# Patient Record
Sex: Female | Born: 1970 | Race: White | Hispanic: No | Marital: Married | Smoking: Never smoker
Health system: Southern US, Community
[De-identification: ages and names within clinical notes are randomized; demographics above are authoritative.]

## PROBLEM LIST (undated history)

## (undated) DIAGNOSIS — J45909 Unspecified asthma, uncomplicated: Secondary | ICD-10-CM

## (undated) DIAGNOSIS — C801 Malignant (primary) neoplasm, unspecified: Secondary | ICD-10-CM

## (undated) DIAGNOSIS — E079 Disorder of thyroid, unspecified: Secondary | ICD-10-CM

## (undated) DIAGNOSIS — K219 Gastro-esophageal reflux disease without esophagitis: Secondary | ICD-10-CM

## (undated) DIAGNOSIS — G4730 Sleep apnea, unspecified: Secondary | ICD-10-CM

## (undated) DIAGNOSIS — T7840XA Allergy, unspecified, initial encounter: Secondary | ICD-10-CM

## (undated) HISTORY — DX: Unspecified asthma, uncomplicated: J45.909

## (undated) HISTORY — DX: Disorder of thyroid, unspecified: E07.9

## (undated) HISTORY — DX: Sleep apnea, unspecified: G47.30

## (undated) HISTORY — PX: APPENDECTOMY: SHX54

## (undated) HISTORY — DX: Gastro-esophageal reflux disease without esophagitis: K21.9

## (undated) HISTORY — DX: Malignant (primary) neoplasm, unspecified: C80.1

## (undated) HISTORY — DX: Allergy, unspecified, initial encounter: T78.40XA

---

## 2021-09-30 ENCOUNTER — Other Ambulatory Visit: Payer: Self-pay | Admitting: Family Medicine

## 2021-09-30 DIAGNOSIS — R102 Pelvic and perineal pain: Secondary | ICD-10-CM

## 2021-10-03 ENCOUNTER — Ambulatory Visit
Admission: RE | Admit: 2021-10-03 | Discharge: 2021-10-03 | Disposition: A | Discharge status: Home / Self Care | Payer: Self-pay | Source: Ambulatory Visit | Attending: Family Medicine | Admitting: Family Medicine

## 2021-10-03 DIAGNOSIS — R102 Pelvic and perineal pain: Secondary | ICD-10-CM

## 2022-05-05 DIAGNOSIS — Z20822 Contact with and (suspected) exposure to covid-19: Secondary | ICD-10-CM | POA: Diagnosis not present

## 2022-05-05 DIAGNOSIS — J45909 Unspecified asthma, uncomplicated: Secondary | ICD-10-CM | POA: Diagnosis not present

## 2022-05-05 DIAGNOSIS — U071 COVID-19: Secondary | ICD-10-CM | POA: Diagnosis not present

## 2022-05-05 DIAGNOSIS — J020 Streptococcal pharyngitis: Secondary | ICD-10-CM | POA: Diagnosis not present

## 2022-05-05 DIAGNOSIS — R059 Cough, unspecified: Secondary | ICD-10-CM | POA: Diagnosis not present

## 2022-05-12 DIAGNOSIS — E039 Hypothyroidism, unspecified: Secondary | ICD-10-CM | POA: Diagnosis not present

## 2022-05-12 DIAGNOSIS — Z13220 Encounter for screening for lipoid disorders: Secondary | ICD-10-CM | POA: Diagnosis not present

## 2022-05-12 DIAGNOSIS — Z131 Encounter for screening for diabetes mellitus: Secondary | ICD-10-CM | POA: Diagnosis not present

## 2022-05-18 DIAGNOSIS — J45909 Unspecified asthma, uncomplicated: Secondary | ICD-10-CM | POA: Diagnosis not present

## 2022-05-18 DIAGNOSIS — Z1211 Encounter for screening for malignant neoplasm of colon: Secondary | ICD-10-CM | POA: Diagnosis not present

## 2022-05-18 DIAGNOSIS — Z6831 Body mass index (BMI) 31.0-31.9, adult: Secondary | ICD-10-CM | POA: Diagnosis not present

## 2022-05-18 DIAGNOSIS — Z1231 Encounter for screening mammogram for malignant neoplasm of breast: Secondary | ICD-10-CM | POA: Diagnosis not present

## 2022-05-18 DIAGNOSIS — Z0000 Encounter for general adult medical examination without abnormal findings: Secondary | ICD-10-CM | POA: Diagnosis not present

## 2022-08-13 DIAGNOSIS — N760 Acute vaginitis: Secondary | ICD-10-CM | POA: Diagnosis not present

## 2022-08-13 DIAGNOSIS — R350 Frequency of micturition: Secondary | ICD-10-CM | POA: Diagnosis not present

## 2022-08-13 DIAGNOSIS — Z23 Encounter for immunization: Secondary | ICD-10-CM | POA: Diagnosis not present

## 2022-08-13 DIAGNOSIS — Z6831 Body mass index (BMI) 31.0-31.9, adult: Secondary | ICD-10-CM | POA: Diagnosis not present

## 2022-08-26 ENCOUNTER — Ambulatory Visit (AMBULATORY_SURGERY_CENTER): Payer: 59

## 2022-08-26 VITALS — Ht 66.0 in | Wt 192.0 lb

## 2022-08-26 DIAGNOSIS — Z1211 Encounter for screening for malignant neoplasm of colon: Secondary | ICD-10-CM

## 2022-08-26 MED ORDER — PLENVU 140 G PO SOLR: 1.0000 | ORAL | Status: DC

## 2022-08-26 NOTE — Progress Notes (Signed)

## 2022-09-17 ENCOUNTER — Telehealth: Payer: Self-pay | Admitting: Gastroenterology

## 2022-09-17 NOTE — Telephone Encounter (Signed)
Inbound call from patient stating that her pharmacy does not have her prep prescription and is requesting it be resent. Please advise.

## 2022-09-17 NOTE — Telephone Encounter (Signed)
[facility] pharmacy. Verbal order for prescription given over the phone to [place] along with Plenvu coupon code. Called patient and let her know that the prescription has been received by the pharmacy. Per patient went to pick up her RX and it was not at the pharmacy. Prep previously sent into the pharmacy on 08/26/22. Pt understand that her selected pharmacy may not get her PLENVU kit in time for her procedure on 09/21/22 due to their hours of operation. She understands if she does not receive her kit by tomorrow she will need to call us back so that we can send her RX to another pharmacy.

## 2022-09-18 ENCOUNTER — Telehealth: Payer: Self-pay | Admitting: *Deleted

## 2022-09-18 ENCOUNTER — Encounter: Payer: Self-pay | Admitting: Gastroenterology

## 2022-09-18 NOTE — Telephone Encounter (Signed)
Returned cal to [employer] at pharmacy. She stated that insurance did not cover the prep Plenvu. Gave Gisell coupon [employer] and ID on coupon.  #'s given

## 2022-09-18 NOTE — Telephone Encounter (Signed)
Called pt.to discuss prep option,unable to give suprep d/t allergy contraindications,instructed pt. To get over the counter miralax prep because pt. Said she can not do gallon jug,new instructions sent via my chart reviewed and verified with pt.   answered all questions.

## 2022-09-18 NOTE — Telephone Encounter (Signed)
Inbound call from [facility] with [facility] stating that patient is needing a PA for prep medication and is requesting we send different prep for patient. Patient is scheduled to have procedure on 1/22. Please advise.

## 2022-09-21 ENCOUNTER — Encounter: Payer: Self-pay | Admitting: Gastroenterology

## 2022-09-21 ENCOUNTER — Ambulatory Visit (AMBULATORY_SURGERY_CENTER): Payer: 59 | Admitting: Gastroenterology

## 2022-09-21 VITALS — BP 108/68 | HR 81 | Temp 97.8°F | Resp 15 | Ht 66.0 in | Wt 192.0 lb

## 2022-09-21 DIAGNOSIS — Z1211 Encounter for screening for malignant neoplasm of colon: Secondary | ICD-10-CM

## 2022-09-21 MED ORDER — SODIUM CHLORIDE 0.9 % IV SOLN: 500.0000 mL | Freq: Once | INTRAVENOUS | Status: DC

## 2022-09-21 NOTE — Progress Notes (Signed)
Pt's states no medical or surgical changes since previsit or office visit. 

## 2022-09-21 NOTE — Op Note (Signed)
[place] Patient Name: Dawn Ramirez Procedure Date: 09/21/2022 2:24 PM MRN: [id number] Endoscopist: Jackquline Denmark , MD, [id number] Age: 52 Referring MD:  Date of Birth: 10/03/1970 Gender: Female Account #: [id number] Procedure:                Colonoscopy Indications:              Screening for colorectal malignant neoplasm Medicines:                Monitored Anesthesia Care Procedure:                Pre-Anesthesia Assessment:                           - Prior to the procedure, a History and Physical                            was performed, and patient medications and                            allergies were reviewed. The patient's tolerance of                            previous anesthesia was also reviewed. The risks                            and benefits of the procedure and the sedation                            options and risks were discussed with the patient.                            All questions were answered, and informed consent                            was obtained. Prior Anticoagulants: The patient has                            taken no anticoagulant or antiplatelet agents. ASA                            Grade Assessment: II - A patient with mild systemic                            disease. After reviewing the risks and benefits,                            the patient was deemed in satisfactory condition to                            undergo the procedure.                           After obtaining informed consent, the colonoscope  was passed under direct vision. Throughout the                            procedure, the patient's blood pressure, pulse, and                            oxygen saturations were monitored continuously. The                            Olympus PCF-H190DL (#[id number]) Colonoscope was                            introduced through the anus and advanced to the 2                            cm into the ileum. The  colonoscopy was performed                            without difficulty. The patient tolerated the                            procedure well. The quality of the bowel                            preparation was good. The terminal ileum, ileocecal                            valve, appendiceal orifice, and rectum were                            photographed. Scope In: 2:29:44 PM Scope Out: 2:44:05 PM Scope Withdrawal Time: 0 hours 11 minutes 0 seconds  Total Procedure Duration: 0 hours 14 minutes 21 seconds  Findings:                 Multiple medium-mouthed diverticula were found in                            the sigmoid colon, few in descending colon and                            ascending colon.                           Non-bleeding internal hemorrhoids were found during                            retroflexion. The hemorrhoids were small and Grade                            I (internal hemorrhoids that do not prolapse).                           The terminal ileum appeared normal.  The exam was otherwise without abnormality on                            direct and retroflexion views. Complications:            No immediate complications. Estimated Blood Loss:     Estimated blood loss: none. Impression:               - Moderate predominantly sigmoid diverticulosis.                           - Non-bleeding internal hemorrhoids.                           - The examined portion of the ileum was normal.                           - The examination was otherwise normal on direct                            and retroflexion views.                           - No specimens collected. Recommendation:           - Patient has a contact number available for                            emergencies. The signs and symptoms of potential                            delayed complications were discussed with the                            patient. Return to normal activities tomorrow.                             Written discharge instructions were provided to the                            patient.                           - High fiber diet.                           - Continue present medications.                           - Repeat colonoscopy in 10 years for screening                            purposes. Earlier, if with any problems or change                            in family history.                           -  The findings and recommendations were discussed                            with the patient's family. Jackquline Denmark, MD 09/21/2022 2:48:00 PM This report has been signed electronically.

## 2022-09-21 NOTE — Progress Notes (Signed)
Pt resting comfortably. VSS. Airway intact. SBAR complete to RN. All questions answered.   

## 2022-09-21 NOTE — Patient Instructions (Signed)

## 2022-09-21 NOTE — Progress Notes (Signed)
[facility] History and Physical   Primary Care Physician:  Marco Collie, MD   Reason for Procedure:   CRC  screening  Plan:    colon    HPI: Dawn Ramirez is a 52 y.o. female   H/O IBS-C  No nausea, vomiting, heartburn, regurgitation, odynophagia or dysphagia.  No significant diarrhea.  No melena or hematochezia. No unintentional weight loss. No abdominal pain.  Past Medical History:  Diagnosis Date   Allergy    Asthma    Cancer ([place])    GERD (gastroesophageal reflux disease)    Sleep apnea    Thyroid disease     Past Surgical History:  Procedure Laterality Date   APPENDECTOMY     47yr    Prior to Admission medications   Medication Sig Start Date End Date Taking? Authorizing Provider  levocetirizine (XYZAL) 5 MG tablet Take 5 mg by mouth at bedtime.   Yes [provider]  omeprazole (PRILOSEC) 20 MG capsule Take by mouth at bedtime. 08/05/22  Yes [provider]  SYNTHROID 125 MCG tablet Take 125 mcg by mouth daily.   Yes [provider]  albuterol (PROVENTIL) (2.5 MG/3ML) 0.083% nebulizer solution Take 2.5 mg by nebulization every 4 (four) hours as needed. 05/18/22   [provider]  EPINEPHrine 0.3 mg/0.3 mL IJ SOAJ injection Inject into the muscle. 10/13/17   [provider]  Multiple Vitamin (MULTI-VITAMIN) tablet Take 1 tablet by mouth daily.    [provider]    Current Outpatient Medications  Medication Sig Dispense Refill   levocetirizine (XYZAL) 5 MG tablet Take 5 mg by mouth at bedtime.     omeprazole (PRILOSEC) 20 MG capsule Take by mouth at bedtime.     SYNTHROID 125 MCG tablet Take 125 mcg by mouth daily.     albuterol (PROVENTIL) (2.5 MG/3ML) 0.083% nebulizer solution Take 2.5 mg by nebulization every 4 (four) hours as needed.     EPINEPHrine 0.3 mg/0.3 mL IJ SOAJ injection Inject into the muscle.     Multiple Vitamin (MULTI-VITAMIN) tablet Take 1 tablet by mouth daily.     No current  facility-administered medications for this visit.    Allergies as of 09/21/2022 - Review Complete 09/21/2022  Allergen Reaction Noted   Antivenin, funnel web spider [antivenin, atrax robustus] Hives 08/26/2022   Bacitra-neomycin-polymyxin-hc Swelling 10/30/2021   Onion Cough 08/26/2022   Pineapple Cough 08/26/2022   Tomato Cough 08/26/2022   Turkey-sweet potatoes-peaches [alitraq] Cough 08/26/2022   Yeast-related products Cough 08/26/2022   Sulfamethoxazole-trimethoprim Other (See Comments), Rash, and Swelling 12/29/2012    Family History  Problem Relation Age of Onset   Colon cancer Neg Hx    Colon polyps Neg Hx    Esophageal cancer Neg Hx    Rectal cancer Neg Hx    Stomach cancer Neg Hx     Social History   Socioeconomic History   Marital status: Married    Spouse name: Not on file   Number of children: Not on file   Years of education: Not on file   Highest education level: Not on file  Occupational History   Not on file  Tobacco Use   Smoking status: Never    Passive exposure: Never   Smokeless tobacco: Never  Vaping Use   Vaping Use: Never used  Substance and Sexual Activity   Alcohol use: Yes    Comment: ocassional   Drug use: Never   Sexual activity: Not on file  Other Topics Concern  Not on file  Social History Narrative   Not on file   Social Determinants of Health   Financial Resource Strain: Not on file  Food Insecurity: Not on file  Transportation Needs: Not on file  Physical Activity: Not on file  Stress: Not on file  Social Connections: Not on file  Intimate Partner Violence: Not on file    Review of Systems: Positive for none All other review of systems negative except as mentioned in the HPI.  Physical Exam: Vital signs in last 24 hours: '@VSRANGES'$ @   General:   Alert,  Well-developed, well-nourished, pleasant and cooperative in NAD Lungs:  Clear throughout to auscultation.   Heart:  Regular rate and rhythm; no murmurs, clicks,  rubs,  or gallops. Abdomen:  Soft, nontender and nondistended. Normal bowel sounds.   Neuro/Psych:  Alert and cooperative. Normal mood and affect. A and O x 3    No significant changes were identified.  The patient continues to be an appropriate candidate for the planned procedure and anesthesia.   Carmell Austria, MD. [facility] 09/21/2022 3:43 PM@

## 2022-09-22 ENCOUNTER — Telehealth: Payer: Self-pay

## 2022-09-22 NOTE — Telephone Encounter (Signed)
No answer, left message to call if having any issues or concerns, B.Khloey Chern RN 

## 2022-11-09 DIAGNOSIS — E039 Hypothyroidism, unspecified: Secondary | ICD-10-CM | POA: Diagnosis not present

## 2022-11-16 DIAGNOSIS — Z6830 Body mass index (BMI) 30.0-30.9, adult: Secondary | ICD-10-CM | POA: Diagnosis not present

## 2022-11-16 DIAGNOSIS — E039 Hypothyroidism, unspecified: Secondary | ICD-10-CM | POA: Diagnosis not present

## 2022-12-21 DIAGNOSIS — Z85820 Personal history of malignant melanoma of skin: Secondary | ICD-10-CM | POA: Diagnosis not present

## 2022-12-21 DIAGNOSIS — D225 Melanocytic nevi of trunk: Secondary | ICD-10-CM | POA: Diagnosis not present

## 2022-12-21 DIAGNOSIS — D2239 Melanocytic nevi of other parts of face: Secondary | ICD-10-CM | POA: Diagnosis not present

## 2022-12-21 DIAGNOSIS — L821 Other seborrheic keratosis: Secondary | ICD-10-CM | POA: Diagnosis not present

## 2022-12-25 DIAGNOSIS — E039 Hypothyroidism, unspecified: Secondary | ICD-10-CM | POA: Diagnosis not present

## 2023-01-05 DIAGNOSIS — N949 Unspecified condition associated with female genital organs and menstrual cycle: Secondary | ICD-10-CM | POA: Diagnosis not present

## 2023-01-05 DIAGNOSIS — Z6830 Body mass index (BMI) 30.0-30.9, adult: Secondary | ICD-10-CM | POA: Diagnosis not present

## 2023-01-05 DIAGNOSIS — N9089 Other specified noninflammatory disorders of vulva and perineum: Secondary | ICD-10-CM | POA: Diagnosis not present

## 2023-01-07 DIAGNOSIS — R300 Dysuria: Secondary | ICD-10-CM | POA: Diagnosis not present

## 2023-01-07 DIAGNOSIS — N909 Noninflammatory disorder of vulva and perineum, unspecified: Secondary | ICD-10-CM | POA: Diagnosis not present

## 2023-06-25 DIAGNOSIS — Z1231 Encounter for screening mammogram for malignant neoplasm of breast: Secondary | ICD-10-CM | POA: Diagnosis not present

## 2023-08-06 IMAGING — US US PELVIS COMPLETE WITH TRANSVAGINAL
1 series · 13 of 25 positions shown · non-contrast
Comparison: 06/01/2014

CLINICAL DATA: Pelvic pain for 1 weeks

Bleeding between cycles for 4-5 months
EXAM:
TRANSABDOMINAL AND TRANSVAGINAL ULTRASOUND OF PELVIS
TECHNIQUE: Both transabdominal and transvaginal ultrasound examinations of the
pelvis were performed. Transabdominal technique was performed for
global imaging of the pelvis including uterus, ovaries, adnexal
regions, and pelvic cul-de-sac. It was necessary to proceed with
endovaginal exam following the transabdominal exam to visualize the
uterus, endometrium, ovaries, and adnexa.

[Series 1: us pelvis complete with transvaginal · 0.26mm/px · 13 of 34 slices shown]
[im 1/34]
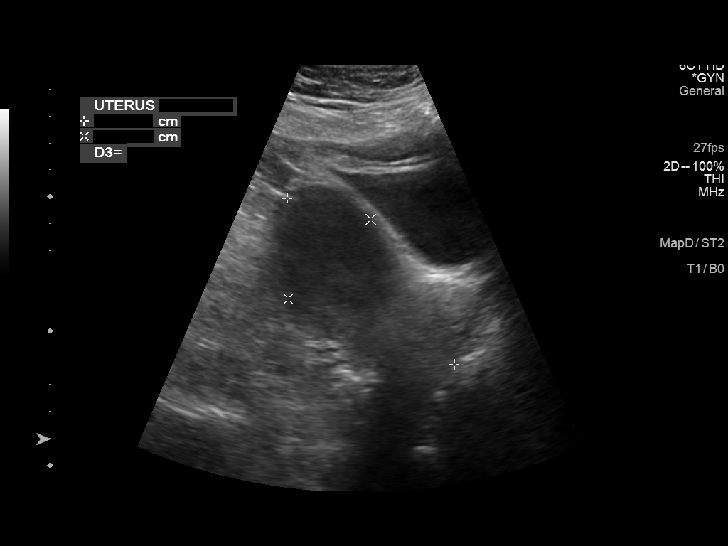
[im 3/34]
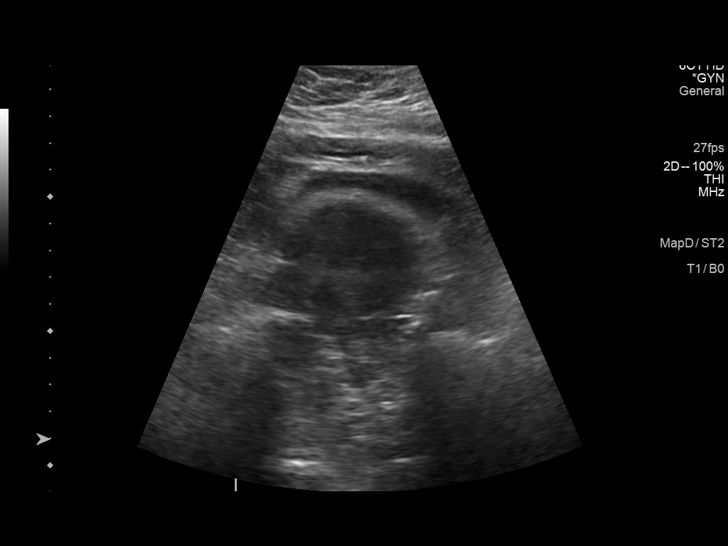
[im 6/34]
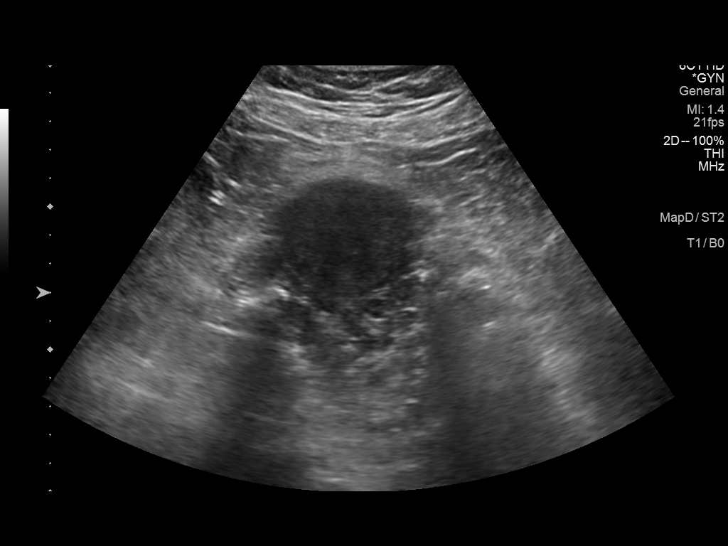
[im 9/34]
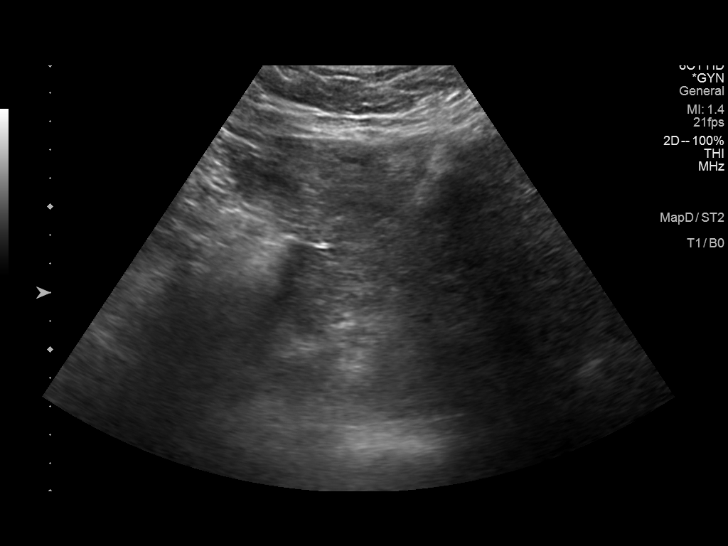
[im 12/34]
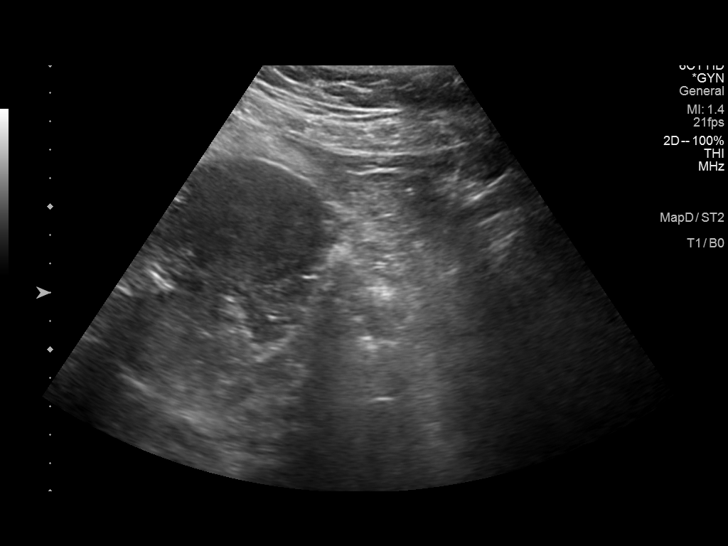
[im 14/34]
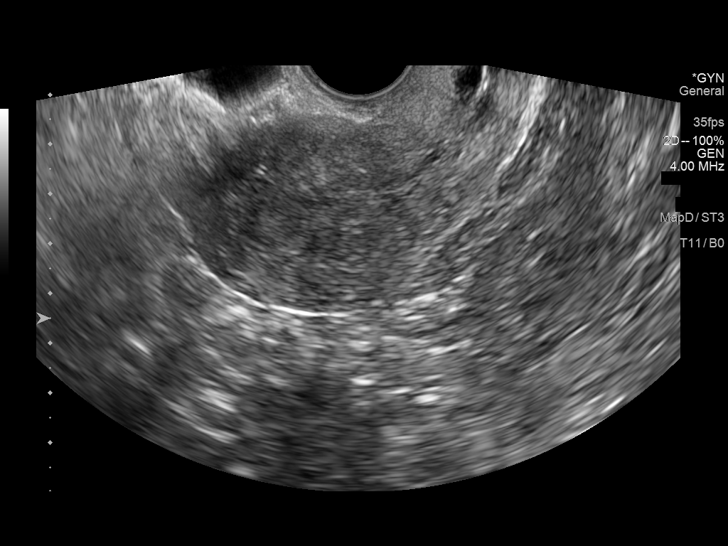
[im 17/34]
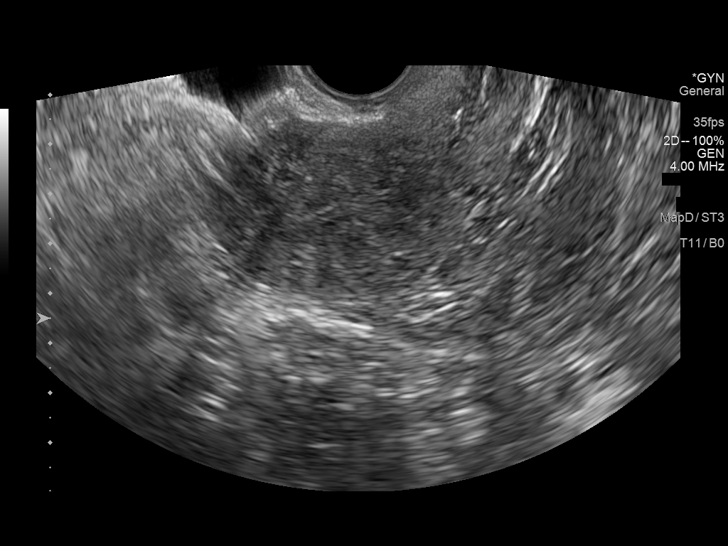
[im 20/34]
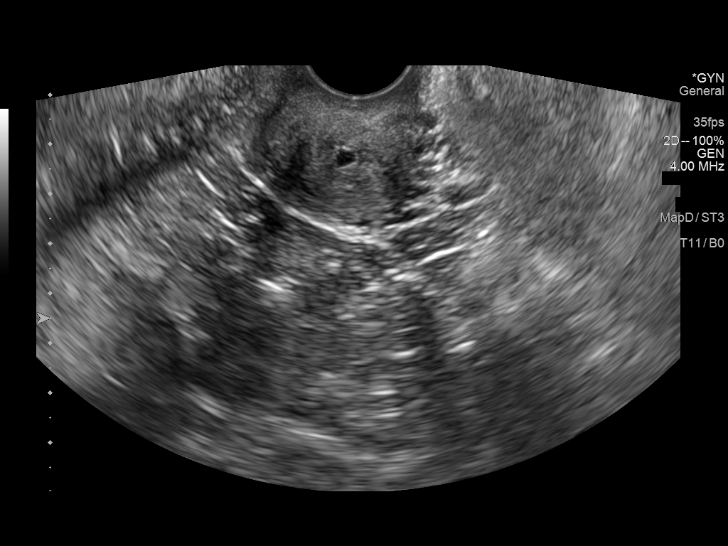
[im 23/34]
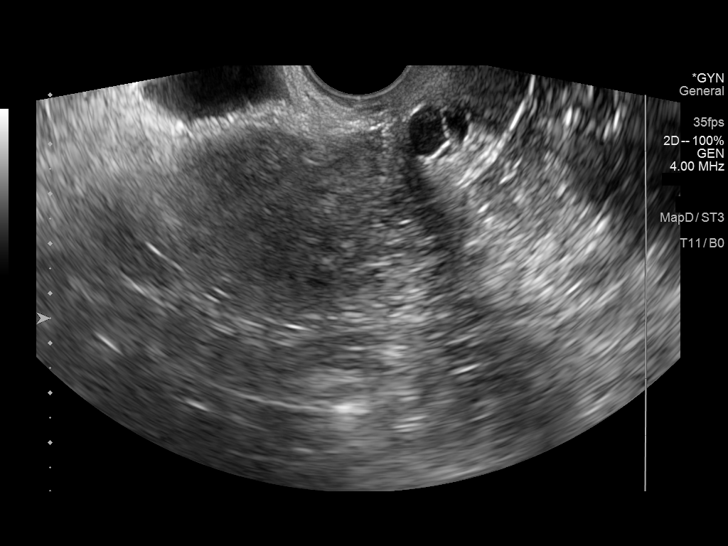
[im 25/34]
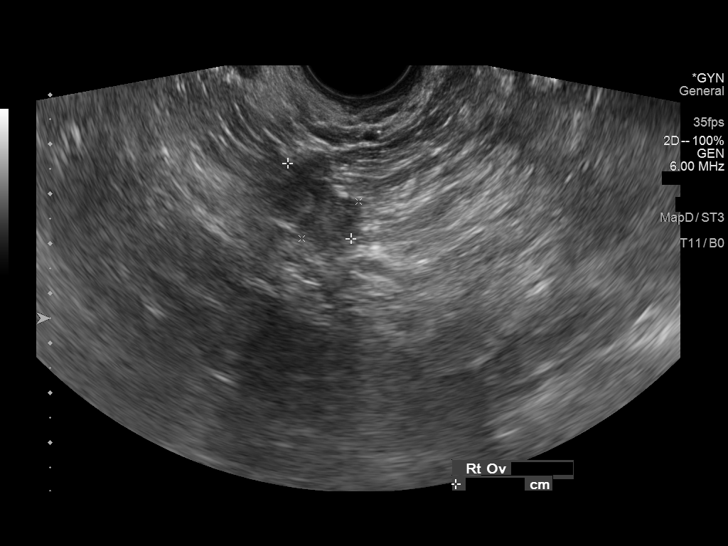
[im 28/34]
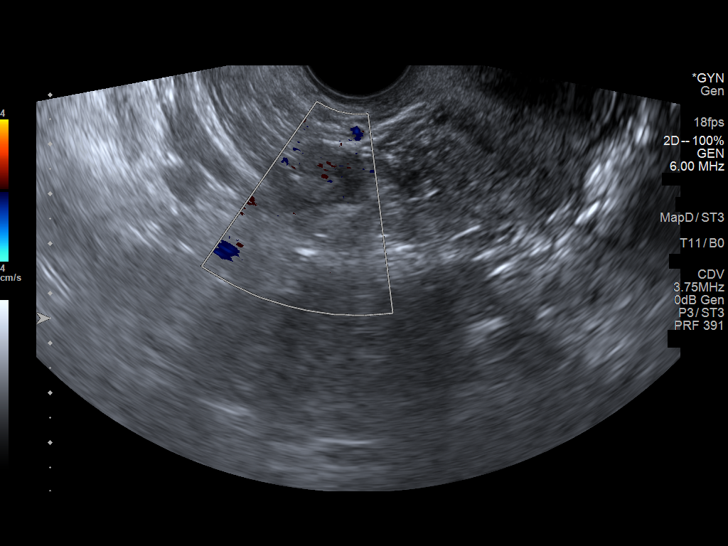
[im 31/34]
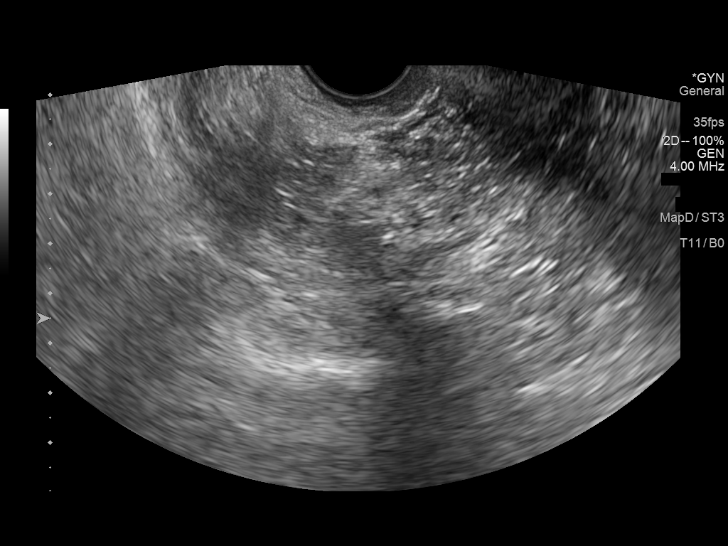
[im 34/34]
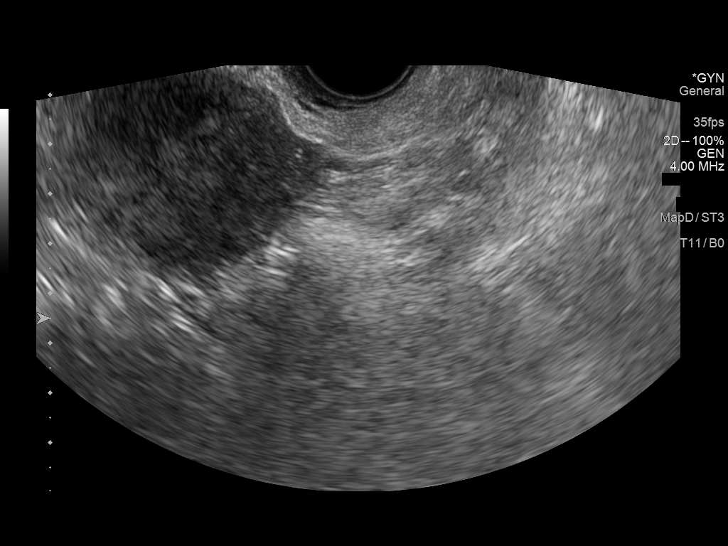

[13 of 25 positions shown; findings below may reference images not displayed]

FINDINGS: Uterus

Measurements: 8.8 x 4.3 x 5.0 cm = volume: 98 mL. There is diffuse
heterogeneity of the myometrium.

Endometrium

Thickness: 3 mm.  No focal abnormality visualized.

Right ovary

Measurements: 2.0 x 1.4 x 1.6 cm = volume: 2.2 mL. Normal appearance

Left ovary

Not visualized.

Other findings

No abnormal free fluid.
IMPRESSION: 1. Diffuse heterogeneity of the myometrium suspicious for
adenomyosis or diffuse small fibroid change. This could be better
evaluated with MRI.
2. Nonvisualization of the left ovary.
3. If bleeding remains unresponsive to hormonal or medical therapy,
sonohysterogram should be considered for focal lesion work-up. (Ref:
Radiological Reasoning: Algorithmic Workup of Abnormal Vaginal
Bleeding with Endovaginal Sonography and Sonohysterography. AJR
1994; 191:S68-73)

## 2023-11-12 DIAGNOSIS — M79672 Pain in left foot: Secondary | ICD-10-CM | POA: Diagnosis not present

## 2023-11-12 DIAGNOSIS — M545 Low back pain, unspecified: Secondary | ICD-10-CM | POA: Diagnosis not present

## 2023-11-12 DIAGNOSIS — G8929 Other chronic pain: Secondary | ICD-10-CM | POA: Diagnosis not present

## 2023-11-23 DIAGNOSIS — M545 Low back pain, unspecified: Secondary | ICD-10-CM | POA: Diagnosis not present

## 2023-11-26 DIAGNOSIS — M79672 Pain in left foot: Secondary | ICD-10-CM | POA: Diagnosis not present

## 2023-11-26 DIAGNOSIS — G8929 Other chronic pain: Secondary | ICD-10-CM | POA: Diagnosis not present

## 2023-11-26 DIAGNOSIS — M2170 Unequal limb length (acquired), unspecified site: Secondary | ICD-10-CM | POA: Diagnosis not present

## 2023-11-26 DIAGNOSIS — M79671 Pain in right foot: Secondary | ICD-10-CM | POA: Diagnosis not present

## 2023-11-29 DIAGNOSIS — M545 Low back pain, unspecified: Secondary | ICD-10-CM | POA: Diagnosis not present

## 2023-12-08 DIAGNOSIS — M545 Low back pain, unspecified: Secondary | ICD-10-CM | POA: Diagnosis not present

## 2023-12-22 DIAGNOSIS — Z85820 Personal history of malignant melanoma of skin: Secondary | ICD-10-CM | POA: Diagnosis not present

## 2023-12-22 DIAGNOSIS — M545 Low back pain, unspecified: Secondary | ICD-10-CM | POA: Diagnosis not present

## 2023-12-22 DIAGNOSIS — D225 Melanocytic nevi of trunk: Secondary | ICD-10-CM | POA: Diagnosis not present

## 2023-12-22 DIAGNOSIS — D485 Neoplasm of uncertain behavior of skin: Secondary | ICD-10-CM | POA: Diagnosis not present

## 2023-12-22 DIAGNOSIS — D2239 Melanocytic nevi of other parts of face: Secondary | ICD-10-CM | POA: Diagnosis not present

## 2023-12-22 DIAGNOSIS — L814 Other melanin hyperpigmentation: Secondary | ICD-10-CM | POA: Diagnosis not present

## 2023-12-22 DIAGNOSIS — G8929 Other chronic pain: Secondary | ICD-10-CM | POA: Diagnosis not present
